# Patient Record
Sex: Male | Born: 1965 | Race: White | Hispanic: No | Marital: Married | Smoking: Never smoker
Health system: Southern US, Community
[De-identification: ages and names within clinical notes are randomized; demographics above are authoritative.]

## PROBLEM LIST (undated history)

## (undated) HISTORY — PX: VASECTOMY: SHX75

---

## 2010-07-15 ENCOUNTER — Emergency Department (HOSPITAL_COMMUNITY)
Admission: EM | Admit: 2010-07-15 | Discharge: 2010-07-15 | Payer: Self-pay | Source: Home / Self Care | Admitting: Emergency Medicine

## 2010-07-27 ENCOUNTER — Encounter: Payer: Self-pay | Admitting: Family Medicine

## 2015-03-27 ENCOUNTER — Emergency Department (HOSPITAL_COMMUNITY): Payer: Managed Care, Other (non HMO)

## 2015-03-27 ENCOUNTER — Emergency Department (HOSPITAL_COMMUNITY)
Admission: EM | Admit: 2015-03-27 | Discharge: 2015-03-27 | Disposition: A | Discharge status: Home / Self Care | Payer: Managed Care, Other (non HMO) | Attending: Emergency Medicine | Admitting: Emergency Medicine

## 2015-03-27 ENCOUNTER — Encounter (HOSPITAL_COMMUNITY): Payer: Self-pay | Admitting: Emergency Medicine

## 2015-03-27 DIAGNOSIS — R9431 Abnormal electrocardiogram [ECG] [EKG]: Secondary | ICD-10-CM | POA: Insufficient documentation

## 2015-03-27 DIAGNOSIS — R1011 Right upper quadrant pain: Secondary | ICD-10-CM | POA: Diagnosis present

## 2015-03-27 LAB — COMPREHENSIVE METABOLIC PANEL
ALBUMIN: 4.4 g/dL (ref 3.5–5.0)
ALT: 51 U/L (ref 17–63)
AST: 25 U/L (ref 15–41)
Alkaline Phosphatase: 51 U/L (ref 38–126)
Anion gap: 10 (ref 5–15)
BUN: 10 mg/dL (ref 6–20)
CHLORIDE: 96 mmol/L — AB (ref 101–111)
CO2: 28 mmol/L (ref 22–32)
Calcium: 9.3 mg/dL (ref 8.9–10.3)
Creatinine, Ser: 0.8 mg/dL (ref 0.61–1.24)
GFR calc Af Amer: >60 mL/min (ref >60)
GFR calc non Af Amer: >60 mL/min (ref >60)
GLUCOSE: 182 mg/dL — AB (ref 65–99)
POTASSIUM: 4.5 mmol/L (ref 3.5–5.1)
SODIUM: 134 mmol/L — AB (ref 135–145)
Total Bilirubin: 1.1 mg/dL (ref 0.3–1.2)
Total Protein: 7.4 g/dL (ref 6.5–8.1)

## 2015-03-27 LAB — CBC
HCT: 47.5 % (ref 39.0–52.0)
Hemoglobin: 16.5 g/dL (ref 13.0–17.0)
MCH: 30.4 pg (ref 26.0–34.0)
MCHC: 34.7 g/dL (ref 30.0–36.0)
MCV: 87.5 fL (ref 78.0–100.0)
PLATELETS: 290 10*3/uL (ref 150–400)
RBC: 5.43 MIL/uL (ref 4.22–5.81)
RDW: 12.7 % (ref 11.5–15.5)
WBC: 9.2 10*3/uL (ref 4.0–10.5)

## 2015-03-27 LAB — I-STAT TROPONIN, ED: Troponin i, poc: 0 ng/mL (ref 0.00–0.08)

## 2015-03-27 LAB — LIPASE, BLOOD: LIPASE: 24 U/L (ref 22–51)

## 2015-03-27 MED ORDER — OMEPRAZOLE 20 MG PO CPDR: 20.0000 mg | DELAYED_RELEASE_CAPSULE | Freq: Every day | ORAL | Status: AC

## 2015-03-27 NOTE — ED Provider Notes (Signed)
The patient is a 49 year old male who has reported several days of pain in the evening in the right upper quadrant which has been intermittent, over the last day or 2 it is happened during the day and today this happening constantly, right upper quadrant, no dyspnea or pain on exertion, mild nausea, no changes in bowel habits, no vomiting, no fevers, no shortness of breath or chest pain. He was sent from the urgent care after an abnormal EKG and knowing that he would need further testing. On exam he has tenderness in the right upper quadrant, no Murphy sign present, no right lower quadrant tenderness, no peritoneal signs or guarding, clear lung sounds, clear heart sounds, EKG has T-wave inversions in inferior leads but no other abnormal findings. Patient was informed of the abnormal EKG and the need further workup for biliary source, we'll also proceed with troponin. Patient is in agreement with the plan and appears hemodynamically stable at this time.  Medical screening examination/treatment/procedure(s) were conducted as a shared visit with non-physician practitioner(s) and myself.  I personally evaluated the patient during the encounter.  Clinical Impression:   Final diagnoses:  Right upper quadrant abdominal pain  Abnormal EKG     EKG Interpretation  Date/Time:  Saturday March 27 2015 10:21:36 EDT Ventricular Rate:  90 PR Interval:  160 QRS Duration: 82 QT Interval:  338 QTC Calculation: 413 R Axis:   79 Text Interpretation:  Normal sinus rhythm Cannot rule out Inferior infarct , age undetermined Abnormal ECG T wave abnormality No old tracing to compare Confirmed by Yassin Scales  MD, Horace Lukas ([id number]) on 03/27/2015 10:26:50 AM        Eber Hong, MD 03/30/15 1753

## 2015-03-27 NOTE — ED Provider Notes (Signed)
CSN: [id number]     Arrival date & time 03/27/15  1013 History   First MD Initiated Contact with Patient 03/27/15 1027     Chief Complaint  Patient presents with  . Abdominal Pain   Jamiel Goncalves is a 49 y.o. male who is otherwise healthy who presents to the ED complaining of intermittent RUQ abdominal pain for the past month. He currently complains of 2/10 RUQ sharp abdominal pain with intermittent nausea. His pain is intermittent and he is unable to identify alleviating or aggravating factors. His pain is not worse with movement. He reports his pain is worse at night. He does report some symptoms of acid reflux recently. He reports taking omeprazole intermittently. The patient denies history of abdominal surgeries. He reports his last bowel movement was this morning and was normal. He denies any chest pain or shortness of breath. The patient denies fevers, chills, chest pain, shortness of breath, DOE, palpitations, diarrhea, vomiting, urinary symptoms, hematochezia, or history of heart problems.  (Consider location/radiation/quality/duration/timing/severity/associated sxs/prior Treatment) HPI  History reviewed. No pertinent past medical history. Past Surgical History  Procedure Laterality Date  . Vasectomy     No family history on file. Social History  Substance Use Topics  . Smoking status: Never Smoker   . Smokeless tobacco: None  . Alcohol Use: Yes    Review of Systems  Constitutional: Negative for fever and chills.  HENT: Negative for congestion and sore throat.   Eyes: Negative for visual disturbance.  Respiratory: Negative for cough, shortness of breath and wheezing.   Cardiovascular: Negative for chest pain, palpitations and leg swelling.  Gastrointestinal: Positive for nausea and abdominal pain. Negative for vomiting, diarrhea and blood in stool.  Genitourinary: Negative for dysuria, urgency, frequency, hematuria and difficulty urinating.  Musculoskeletal: Negative for  back pain and neck pain.  Skin: Negative for rash.  Neurological: Negative for headaches.      Allergies  Review of patient's allergies indicates no known allergies.  Home Medications   Prior to Admission medications   Medication Sig Start Date End Date Taking? Authorizing Provider  omeprazole (PRILOSEC) 20 MG capsule Take 1 capsule (20 mg total) by mouth daily. 03/27/15   Everlene Farrier, PA-C   BP 115/74 mmHg  Pulse 83  Temp(Src) 97.5 F (36.4 C) (Oral)  Resp 13  SpO2 93% Physical Exam  Constitutional: He is oriented to person, place, and time. He appears well-developed and well-nourished. No distress.  Nontoxic appearing.  HENT:  Head: Normocephalic and atraumatic.  Mouth/Throat: Oropharynx is clear and moist.  Eyes: Conjunctivae are normal. Pupils are equal, round, and reactive to light. Right eye exhibits no discharge. Left eye exhibits no discharge.  Neck: Normal range of motion. Neck supple. No JVD present. No tracheal deviation present.  Cardiovascular: Normal rate, regular rhythm, normal heart sounds and intact distal pulses.  Exam reveals no gallop and no friction rub.   No murmur heard. Bilateral radial pulses are intact. Heart rate is 92.  Pulmonary/Chest: Effort normal and breath sounds normal. No respiratory distress. He has no wheezes. He has no rales. He exhibits no tenderness.  Lungs are clear to auscultation bilaterally. No chest wall tenderness.  Abdominal: Soft. Bowel sounds are normal. He exhibits no distension and no mass. There is tenderness. There is no rebound and no guarding.  Abdomen is soft. Bowel sounds are present. Patient has right upper quadrant tenderness to palpation with mild guarding. No RLQ tenderness. No CVA or flank tenderness. Negative psoas and obturator sign.  Musculoskeletal: He exhibits no edema or tenderness.  No lower extremity edema or tenderness.  Lymphadenopathy:    He has no cervical adenopathy.  Neurological: He is alert and  oriented to person, place, and time. Coordination normal.  Skin: Skin is warm and dry. No rash noted. He is not diaphoretic. No erythema. No pallor.  Psychiatric: He has a normal mood and affect. His behavior is normal.  Nursing note and vitals reviewed.   ED Course  Procedures (including critical care time) Labs Review Labs Reviewed  COMPREHENSIVE METABOLIC PANEL - Abnormal; Notable for the following:    Sodium 134 (*)    Chloride 96 (*)    Glucose, Bld 182 (*)    All other components within normal limits  CBC  LIPASE, BLOOD  I-STAT TROPOININ, ED    Imaging Review US Abdomen Complete  03/27/2015   CLINICAL DATA:  One month history of abdominal pain, progressive  EXAM: ULTRASOUND ABDOMEN COMPLETE  COMPARISON:  None.  FINDINGS: Gallbladder: No gallstones or wall thickening visualized. There is no pericholecystic fluid. No sonographic Murphy sign noted.  Common bile duct: Diameter: 5 mm. There is no intrahepatic, common hepatic, or common bile duct dilatation.  Liver: No focal lesion identified. Liver echogenicity is diffusely increased.  IVC: No abnormality visualized.  Pancreas: Visualized portion unremarkable. Portions of pancreas obscured by gas.  Spleen: Size and appearance within normal limits.  Right Kidney: Length: 13.2 cm. Echogenicity within normal limits. No hydronephrosis visualized. There are several simple cysts arising from the left kidney. The largest cyst is in the mid kidney region measuring 5.6 x 4.9 x 4.7 cm. There is a nearby cyst measuring 5.4 x 5.3 by 5.0 cm. A third cyst measures 4.3 x 3.3 x 3.5 cm. No non cystic renal masses are identified.  Left Kidney: Length: 13.9 cm. Echogenicity within normal limits. No mass or hydronephrosis visualized.  Abdominal aorta: No aneurysm visualized.  Other findings: No demonstrable ascites.  IMPRESSION: Diffuse increased echogenicity of the liver is noted, most likely due to hepatic steatosis. While no focal liver lesions are identified,  it must be cautioned that the sensitivity of ultrasound for focal liver lesions is diminished in this circumstance.  Portions of the pancreas are obscured by gas. Visualized portions of pancreas appear normal.  Left renal cysts, largest cyst measuring 5.6 x 4.9 x 4.7 cm.  Study otherwise unremarkable.   Electronically Signed   By: Bretta Bang III M.D.   On: 03/27/2015 13:31   I have personally reviewed and evaluated these images and lab results as part of my medical decision-making.   EKG Interpretation   Date/Time:  Saturday March 27 2015 10:21:36 EDT Ventricular Rate:  90 PR Interval:  160 QRS Duration: 82 QT Interval:  338 QTC Calculation: 413 R Axis:   79 Text Interpretation:  Normal sinus rhythm Cannot rule out Inferior infarct  , age undetermined Abnormal ECG T wave abnormality No old tracing to  compare Confirmed by MILLER  MD, BRIAN ([id number]) on 03/27/2015 10:26:50 AM      Filed Vitals:   03/27/15 1019 03/27/15 1046 03/27/15 1330  BP: 138/96 134/86 115/74  Pulse: 102 92 83  Temp: 97.5 F (36.4 C)    TempSrc: Oral    Resp: SpO2: 96% 97% 93%     MDM   Meds given in ED:  Medications - No data to display  New Prescriptions   OMEPRAZOLE (PRILOSEC) 20 MG CAPSULE    Take 1  capsule (20 mg total) by mouth daily.    Final diagnoses:  Right upper quadrant abdominal pain  Abnormal EKG    This is a 49 y.o. male who is otherwise healthy who presents to the ED complaining of intermittent RUQ abdominal pain for the past month. He currently complains of 2/10 RUQ sharp abdominal pain with intermittent nausea. He denies current nausea. His pain is intermittent and he is unable to identify alleviating or aggravating factors. His pain is not worse with movement. He reports his pain is worse at night. He does report some symptoms of acid reflux recently. He reports taking omeprazole intermittently. The patient denies history of abdominal surgeries. He reports his last  bowel movement was this morning and was normal.  On exam the patient is afebrile nontoxic appearing. His abdomen is soft and has right upper quadrant abdominal tenderness to palpation. No peritoneal signs. He denies any chest pain or shortness of breath. EKG shows normal sinus rhythm with nonspecific T-wave abnormalities. I advised the patient of this abnormal EKG and encouraged follow-up with PCP and cardiology. Patient's troponin is negative. CMP is remarkable only for glucose of 182. He has normal liver enzymes. CBC is within normal limits. Lipase is 24. Abdominal ultrasound was obtained which showed an unremarkable gallbladder. It did show hepatic steatosis which the patient reports he has been previously told that he had. I also advised the patient of his left renal cysts and encouraged him to tell his primary care provider in follow-up. I advised patient needs to follow-up with his primary care provider and seek HIDA scan. I also encouraged him to start taking omeprazole 20 mg daily. Throughout the visit the patient had no nausea or vomiting. We'll discharge with strict return precautions and follow-up with primary care. I advised the patient to follow-up with their primary care provider this week. I advised the patient to return to the emergency department with new or worsening symptoms or new concerns. The patient verbalized understanding and agreement with plan.    This patient was discussed with and evaluated by Dr. Hyacinth Meeker who agrees with assessment and plan.    Everlene Farrier, PA-C 03/27/15 1402  Eber Hong, MD 03/30/15 [phone]

## 2015-03-27 NOTE — Discharge Instructions (Signed)
Please follow up with your primary care provider to have a repeat EKG and look into having a HIDA.   Abdominal Pain Many things can cause abdominal pain. Usually, abdominal pain is not caused by a disease and will improve without treatment. It can often be observed and treated at home. Your health care provider will do a physical exam and possibly order blood tests and X-rays to help determine the seriousness of your pain. However, in many cases, more time must pass before a clear cause of the pain can be found. Before that point, your health care provider may not know if you need more testing or further treatment. HOME CARE INSTRUCTIONS  Monitor your abdominal pain for any changes. The following actions may help to alleviate any discomfort you are experiencing:  Only take over-the-counter or prescription medicines as directed by your health care provider.  Do not take laxatives unless directed to do so by your health care provider.  Try a clear liquid diet (broth, tea, or water) as directed by your health care provider. Slowly move to a bland diet as tolerated. SEEK MEDICAL CARE IF:  You have unexplained abdominal pain.  You have abdominal pain associated with nausea or diarrhea.  You have pain when you urinate or have a bowel movement.  You experience abdominal pain that wakes you in the night.  You have abdominal pain that is worsened or improved by eating food.  You have abdominal pain that is worsened with eating fatty foods.  You have a fever. SEEK IMMEDIATE MEDICAL CARE IF:   Your pain does not go away within 2 hours.  You keep throwing up (vomiting).  Your pain is felt only in portions of the abdomen, such as the right side or the left lower portion of the abdomen.  You pass bloody or black tarry stools. MAKE SURE YOU:  Understand these instructions.   Will watch your condition.   Will get help right away if you are not doing well or get worse.  Document  Released: 03/22/2005 Document Revised: 06/17/2013 Document Reviewed: 02/19/2013 [facility] Patient Information 2015 [place], [place]. This information is not intended to replace advice given to you by your health care provider. Make sure you discuss any questions you have with your health care provider.  Biliary Colic  Biliary colic is a steady or irregular pain in the upper abdomen. It is usually under the right side of the rib cage. It happens when gallstones interfere with the normal flow of bile from the gallbladder. Bile is a liquid that helps to digest fats. Bile is made in the liver and stored in the gallbladder. When you eat a meal, bile passes from the gallbladder through the cystic duct and the common bile duct into the small intestine. There, it mixes with partially digested food. If a gallstone blocks either of these ducts, the normal flow of bile is blocked. The muscle cells in the bile duct contract forcefully to try to move the stone. This causes the pain of biliary colic.  SYMPTOMS   A person with biliary colic usually complains of pain in the upper abdomen. This pain can be:  In the center of the upper abdomen just below the breastbone.  In the upper-right part of the abdomen, near the gallbladder and liver.  Spread back toward the right shoulder blade.  Nausea and vomiting.  The pain usually occurs after eating.  Biliary colic is usually triggered by the digestive system's demand for bile. The demand for  bile is high after fatty meals. Symptoms can also occur when a person who has been fasting suddenly eats a very large meal. Most episodes of biliary colic pass after 1 to 5 hours. After the most intense pain passes, your abdomen may continue to ache mildly for about 24 hours. DIAGNOSIS  After you describe your symptoms, your caregiver will perform a physical exam. He or she will pay attention to the upper right portion of your belly (abdomen). This is the area of your liver and  gallbladder. An ultrasound will help your caregiver look for gallstones. Specialized scans of the gallbladder may also be done. Blood tests may be done, especially if you have fever or if your pain persists. PREVENTION  Biliary colic can be prevented by controlling the risk factors for gallstones. Some of these risk factors, such as heredity, increasing age, and pregnancy are a normal part of life. Obesity and a high-fat diet are risk factors you can change through a healthy lifestyle. Women going through menopause who take hormone replacement therapy (estrogen) are also more likely to develop biliary colic. TREATMENT   Pain medication may be prescribed.  You may be encouraged to eat a fat-free diet.  If the first episode of biliary colic is severe, or episodes of colic keep retuning, surgery to remove the gallbladder (cholecystectomy) is usually recommended. This procedure can be done through small incisions using an instrument called a laparoscope. The procedure often requires a brief stay in the hospital. Some people can leave the hospital the same day. It is the most widely used treatment in people troubled by painful gallstones. It is effective and safe, with no complications in more than 90% of cases.  If surgery cannot be done, medication that dissolves gallstones may be used. This medication is expensive and can take months or years to work. Only small stones will dissolve.  Rarely, medication to dissolve gallstones is combined with a procedure called shock-wave lithotripsy. This procedure uses carefully aimed shock waves to break up gallstones. In many people treated with this procedure, gallstones form again within a few years. PROGNOSIS  If gallstones block your cystic duct or common bile duct, you are at risk for repeated episodes of biliary colic. There is also a 25% chance that you will develop a gallbladder infection(acute cholecystitis), or some other complication of gallstones within  10 to 20 years. If you have surgery, schedule it at a time that is convenient for you and at a time when you are not sick. HOME CARE INSTRUCTIONS   Drink plenty of clear fluids.  Avoid fatty, greasy or fried foods, or any foods that make your pain worse.  Take medications as directed. SEEK MEDICAL CARE IF:   You develop a fever over 100.5 F (38.1 C).  Your pain gets worse over time.  You develop nausea that prevents you from eating and drinking.  You develop vomiting. SEEK IMMEDIATE MEDICAL CARE IF:   You have continuous or severe belly (abdominal) pain which is not relieved with medications.  You develop nausea and vomiting which is not relieved with medications.  You have symptoms of biliary colic and you suddenly develop a fever and shaking chills. This may signal cholecystitis. Call your caregiver immediately.  You develop a yellow color to your skin or the white part of your eyes (jaundice). Document Released: 11/13/2005 Document Revised: 09/04/2011 Document Reviewed: 01/23/2008 [facility] Patient Information 2015 [place], [place]. This information is not intended to replace advice given to you by your  health care provider. Make sure you discuss any questions you have with your health care provider.  HIDA (Hepatobiliary) Scan Your caregiver has suggested that you have a HIDA Scan. This is also known as a hepatobiliary scan. The HIDA Scan helps evaluate the hepatobiliary system (liver and gallbladder and their ducts). Your liver is the organ in your body that produces bile. The bile is then collected in the gallbladder. The bile is stored and concentrated in the gallbladder. The bile is excreted (passed) into the small intestine when it is needed for digestion. A stone can block the duct (tube) leading from the gallbladder to the small intestine. This can cause an inflammation of the gallbladder (cholecystitis). Because bile is always needed for fat processing, you may feel a  gallbladder attack especially after eating a fatty meal. LET YOUR CAREGIVER KNOW ABOUT:  Allergies.  Medications taken including herbs, eye drops, over the counter medications, and creams.  Use of steroids (by mouth or creams).  Previous problems with anesthetics or novocaine.  Possibility of pregnancy, if this applies.  History of blood clots (thrombophlebitis).  History of bleeding or blood problems.  Previous surgery.  Other health problems. BEFORE THE PROCEDURE  Do not eat or drink anything after midnight the night before the exam as instructed.  You may take medications with a small amount of water the morning of the exam unless your caregiver instructs you otherwise. You should be present 60 minutes prior to your procedure or as directed.  PROCEDURE   An IV will be placed in your arm and remain throughout the exam.  A small amount of very short acting radioactive material will be injected into the IV.  While lying down a special camera will be placed over your abdomen (belly). This camera is used to detect the injected material. The camera will place images on film. A radiologist (specialist in reading x-rays) can evaluate the images. It will help determine how well your gallbladder is working.  You will then be given a material called CCK. This will make your gallbladder contract. It occasionally causes symptoms (problems) that mimic a gallbladder attack or the feeling you have after eating a fatty meal.  The entire test usually takes one to two hours. Your caregiver can give you more accurate times. Following the test you may go home and resume normal activities and diet as instructed. Ask your caregiver how you are to find out your results. Remember, it is your responsibility to find out the results of your test. Do not assume everything is all right or "normal" if you have not heard from your caregiver. Document Released: 06/09/2000 Document Revised: 09/04/2011  Document Reviewed: 10/15/2013 [facility] Patient Information 2015 [place], [place]. This information is not intended to replace advice given to you by your health care provider. Make sure you discuss any questions you have with your health care provider.

## 2015-03-27 NOTE — ED Notes (Signed)
Patient transported to X-ray 

## 2015-03-27 NOTE — ED Notes (Signed)
Patient transported to Ultrasound 

## 2015-03-27 NOTE — ED Notes (Signed)
Pt c/o right upper abdominal pain that occurs mainly at night intermittent x 1 month. Pt went to urgent care today and told he should come to the ED for further eval.

## 2015-03-30 ENCOUNTER — Other Ambulatory Visit (HOSPITAL_COMMUNITY): Payer: Self-pay | Admitting: Nurse Practitioner

## 2015-03-30 DIAGNOSIS — R1011 Right upper quadrant pain: Secondary | ICD-10-CM

## 2015-04-07 ENCOUNTER — Ambulatory Visit (HOSPITAL_COMMUNITY)
Admission: RE | Admit: 2015-04-07 | Discharge: 2015-04-07 | Disposition: A | Discharge status: Home / Self Care | Payer: Managed Care, Other (non HMO) | Source: Ambulatory Visit | Attending: Nurse Practitioner | Admitting: Nurse Practitioner

## 2015-04-07 DIAGNOSIS — R1011 Right upper quadrant pain: Secondary | ICD-10-CM | POA: Diagnosis present

## 2015-04-07 MED ORDER — TECHNETIUM TC 99M MEBROFENIN IV KIT
5.0000 | PACK | Freq: Once | INTRAVENOUS | Status: DC | PRN
  Administered 2015-04-07: 5 via INTRAVENOUS
  Filled 2015-04-07: qty 6

## 2015-04-07 MED ORDER — STERILE WATER FOR INJECTION IJ SOLN
INTRAMUSCULAR | Status: AC
Start: 2015-04-07 — End: 2015-04-07
  Filled 2015-04-07: qty 10

## 2015-04-07 MED ORDER — SINCALIDE 5 MCG IJ SOLR
0.0200 ug/kg | Freq: Once | INTRAMUSCULAR | Status: AC
  Administered 2015-04-07: 5.5 ug via INTRAVENOUS

## 2015-04-07 MED ORDER — SINCALIDE 5 MCG IJ SOLR
INTRAMUSCULAR | Status: AC
  Filled 2015-04-07: qty 10

## 2015-04-13 ENCOUNTER — Encounter (HOSPITAL_COMMUNITY): Payer: Managed Care, Other (non HMO)

## 2016-06-21 IMAGING — NM NM HEPATO W/GB/PHARM/[PERSON_NAME]
3 series · 13 of 13 positions shown · non-contrast
Comparison: Abdominal ultrasound March 27, 2015.

CLINICAL DATA: Right upper quadrant abdominal pain for the past 4
weeks

EXAM:
NUCLEAR MEDICINE HEPATOBILIARY IMAGING WITH GALLBLADDER EF
TECHNIQUE: Sequential images of the abdomen were obtained [DATE] minutes
following intravenous administration of radiopharmaceutical. After
slow intravenous infusion of 2.2 micrograms Cholecystokinin,
gallbladder ejection fraction was determined.
RADIOPHARMACEUTICALS:  5.0 mCi [id number] Choletec IV

[he hepatobiliary · 1 of 1 slices shown (1 of 3)]
[im 1/1]
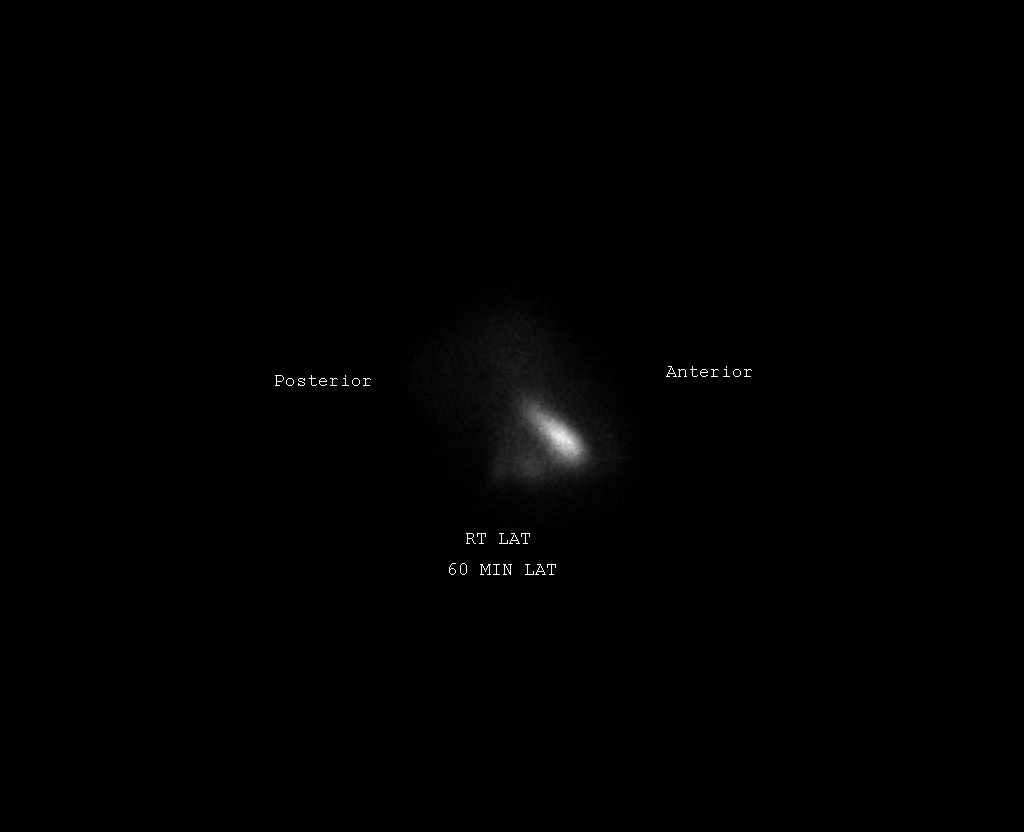

[he hepatobiliary · 3.43mm/px · 6 of 60 frames shown (2 of 3)]
[frame 6/60]
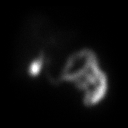
[frame 16/60]
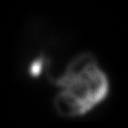
[frame 26/60]
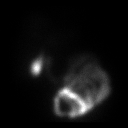
[frame 36/60]
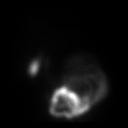
[frame 46/60]
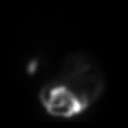
[frame 56/60]
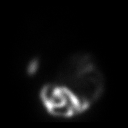

[he hepatobiliary · 3.43mm/px · 6 of 33 frames shown (3 of 3)]
[frame 3/33]
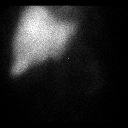
[frame 9/33]
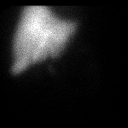
[frame 14/33]
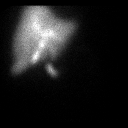
[frame 20/33]
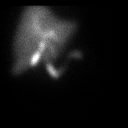
[frame 25/33]
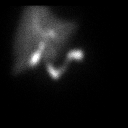
[frame 31/33]
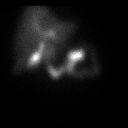

[13 of 13 positions shown; findings below may reference images not displayed]

FINDINGS: There is adequate uptake of the radiopharmaceutical by the skeleton.
The intrahepatic ducts are visible by 10 minutes. The common bile
duct and gallbladder and bowel are visible by 15 minutes. Following
CCK administration the 45 minutes gallbladder ejection fraction is
62%.. At 45 min, normal ejection fraction is greater than 40%. The
patient experienced and pressure light sensation during CCK
infusion.
IMPRESSION: Normal hepatobiliary scan with normal gallbladder ejection fraction.

## 2017-03-07 ENCOUNTER — Other Ambulatory Visit: Payer: Self-pay | Admitting: Urology

## 2017-03-07 DIAGNOSIS — N2889 Other specified disorders of kidney and ureter: Secondary | ICD-10-CM

## 2017-03-15 ENCOUNTER — Other Ambulatory Visit: Payer: Self-pay | Admitting: Urology

## 2017-03-15 DIAGNOSIS — N2889 Other specified disorders of kidney and ureter: Secondary | ICD-10-CM

## 2017-03-20 ENCOUNTER — Other Ambulatory Visit: Payer: Managed Care, Other (non HMO)

## 2017-03-28 ENCOUNTER — Other Ambulatory Visit: Payer: Managed Care, Other (non HMO)

## 2019-09-05 ENCOUNTER — Ambulatory Visit: Payer: BC Managed Care – PPO | Attending: Internal Medicine

## 2019-09-05 DIAGNOSIS — Z23 Encounter for immunization: Secondary | ICD-10-CM

## 2019-09-05 NOTE — Progress Notes (Signed)
   Covid-[facility] Vaccination Clinic  Name:  Teofil Maniaci    MRN: [id number] DOB: [birth year]  09/05/2019  Mr. Christiano was observed post Covid-19 immunization for 15 minutes without incident. He was provided with Vaccine Information Sheet and instruction to access the V-Safe system.   Mr. Divis was instructed to call 911 with any severe reactions post vaccine: Marland Kitchen Difficulty breathing  . Swelling of face and throat  . A fast heartbeat  . A bad rash all over body  . Dizziness and weakness   Immunizations Administered    Name Date Dose VIS Date Route   Pfizer COVID-19 Vaccine 09/05/2019 12:31 PM 0.3 mL 06/06/2019 Intramuscular   Manufacturer: [employer], [employer]   Lot: [id number]   NDC: [address]-1

## 2019-09-30 ENCOUNTER — Ambulatory Visit: Payer: BC Managed Care – PPO | Attending: Internal Medicine

## 2019-09-30 DIAGNOSIS — Z23 Encounter for immunization: Secondary | ICD-10-CM

## 2019-09-30 NOTE — Progress Notes (Signed)
   [facility] Vaccination [facility]  Name:  Taisei Bonnette    MRN: [id number] DOB: [birth year]  09/30/2019  Mr. Samuelson was observed post Covid-19 immunization for 15 minutes without incident. He was provided with Vaccine Information Sheet and instruction to access the V-Safe system.   Mr. Crisman was instructed to call 911 with any severe reactions post vaccine: Marland Kitchen Difficulty breathing  . Swelling of face and throat  . A fast heartbeat  . A bad rash all over body  . Dizziness and weakness   Immunizations Administered    Name Date Dose VIS Date Route   Pfizer COVID-19 Vaccine 09/30/2019  8:57 AM 0.3 mL 06/06/2019 Intramuscular   Manufacturer: [employer], [employer]   Lot: [id number]   NDC: [address]-0

## 2022-10-02 ENCOUNTER — Other Ambulatory Visit (HOSPITAL_BASED_OUTPATIENT_CLINIC_OR_DEPARTMENT_OTHER): Payer: Self-pay

## 2022-10-02 ENCOUNTER — Encounter (HOSPITAL_BASED_OUTPATIENT_CLINIC_OR_DEPARTMENT_OTHER): Payer: Self-pay

## 2022-10-02 MED ORDER — MOUNJARO 15 MG/0.5ML ~~LOC~~ SOAJ
15.0000 mg | SUBCUTANEOUS | 3 refills | Status: AC
  Filled 2022-10-02: qty 2, 28d supply, fill #0

## 2022-10-20 ENCOUNTER — Other Ambulatory Visit (HOSPITAL_BASED_OUTPATIENT_CLINIC_OR_DEPARTMENT_OTHER): Payer: Self-pay
